# Patient Record
Sex: Female | Born: 1970 | Race: Black or African American | Hispanic: No | Marital: Single
Health system: Southern US, Community
[De-identification: ages and names within clinical notes are randomized; demographics above are authoritative.]

---

## 1997-05-23 ENCOUNTER — Other Ambulatory Visit: Admission: RE | Admit: 1997-05-23 | Discharge: 1997-05-23 | Payer: Self-pay | Admitting: Obstetrics and Gynecology

## 1998-01-04 ENCOUNTER — Inpatient Hospital Stay (HOSPITAL_COMMUNITY): Admission: AD | Admit: 1998-01-04 | Discharge: 1998-01-06 | Payer: Self-pay | Admitting: Obstetrics and Gynecology

## 1998-01-26 ENCOUNTER — Other Ambulatory Visit: Admission: RE | Admit: 1998-01-26 | Discharge: 1998-01-26 | Payer: Self-pay | Admitting: Obstetrics and Gynecology

## 1998-07-31 ENCOUNTER — Other Ambulatory Visit: Admission: RE | Admit: 1998-07-31 | Discharge: 1998-07-31 | Payer: Self-pay | Admitting: Obstetrics and Gynecology

## 1998-11-17 ENCOUNTER — Inpatient Hospital Stay (HOSPITAL_COMMUNITY): Admission: AD | Admit: 1998-11-17 | Discharge: 1998-11-17 | Payer: Self-pay | Admitting: Obstetrics and Gynecology

## 1999-02-11 ENCOUNTER — Inpatient Hospital Stay (HOSPITAL_COMMUNITY): Admission: AD | Admit: 1999-02-11 | Discharge: 1999-02-14 | Payer: Self-pay | Admitting: Obstetrics & Gynecology

## 1999-04-16 ENCOUNTER — Ambulatory Visit (HOSPITAL_COMMUNITY): Admission: RE | Admit: 1999-04-16 | Discharge: 1999-04-16 | Payer: Self-pay | Admitting: Obstetrics and Gynecology

## 1999-06-29 ENCOUNTER — Encounter: Payer: Self-pay | Admitting: Emergency Medicine

## 1999-06-29 ENCOUNTER — Emergency Department (HOSPITAL_COMMUNITY): Admission: EM | Admit: 1999-06-29 | Discharge: 1999-06-29 | Payer: Self-pay | Admitting: Emergency Medicine

## 1999-07-21 ENCOUNTER — Ambulatory Visit (HOSPITAL_COMMUNITY): Admission: RE | Admit: 1999-07-21 | Discharge: 1999-07-21 | Payer: Self-pay

## 2007-12-24 ENCOUNTER — Emergency Department (HOSPITAL_COMMUNITY): Admission: EM | Admit: 2007-12-24 | Discharge: 2007-12-25 | Payer: Self-pay | Admitting: Emergency Medicine

## 2007-12-26 ENCOUNTER — Encounter (INDEPENDENT_AMBULATORY_CARE_PROVIDER_SITE_OTHER): Payer: Self-pay | Admitting: General Surgery

## 2007-12-26 ENCOUNTER — Ambulatory Visit (HOSPITAL_COMMUNITY): Admission: RE | Admit: 2007-12-26 | Discharge: 2007-12-27 | Payer: Self-pay | Admitting: General Surgery

## 2010-06-22 NOTE — Op Note (Signed)
[name], [name]             ACCOUNT NO.:  [id number]   MEDICAL RECORD NO.:  [id number]          PATIENT TYPE:  OIB   LOCATION:  1532                         FACILITY:  [facility]   PHYSICIAN:  [name], M.D.DATE OF BIRTH:  [birth year]   DATE OF PROCEDURE:  12/26/2007  DATE OF DISCHARGE:                               OPERATIVE REPORT   PREOPERATIVE DIAGNOSES:  Chronic cholecystitis with stones.  Clinically,  she passed a common duct stone.   POSTOPERATIVE DIAGNOSES:  Chronic cholecystitis with stones.  Clinically, she passed a common duct stone.   OPERATIONS:  Laparoscopic cholecystectomy with cholangiogram.   ANESTHESIA:  General anesthesia.   SURGEON:  Dr. Consuello Ali.   ASSISTANT:  Dr. Leonie Ali.   HISTORY:  [name] is a 40 year old female who started having  episodes of epigastric pain that later radiated to her back over the  weekend.  She went to the [place] walk-in clinic on Monday.  She was  evaluated and found to have bile in her urine.  The white count was not  elevated, but she was advised to go to the [facility] emergency room  which she did.  When she checked in there about 8 o'clock, she said her  pain subsided shortly afterwards.  She was seen by the ER physician.  They got laboratory studies and her bilirubin was approximately 3.5 and  SGOT and SGPT clinically significantly elevated.  They repeated a CBC  and the white count was not elevated.  They did not give her any pain  medication, but she was there most of the evening.  They called me at  approximately 5:00 a.m. on Tuesday morning.  I said I would see her, and  I did about an hour later and she was completely asymptomatic.  I looked  at the ultrasound and felt that clinically she had definitely passed a  common duct stone and you could see that there were multiple stones  within her gallbladder.  I recommended that one since she was pain free  that I would add her onto the OR  schedule the following day as I did  have time available and repeat her liver function studies and if the  liver tests were improving that we would proceed on with a lap  gallbladder with cholangiogram.  If her bilirubin was becoming more  elevated, then we would consider a preoperative ERCP.  She returns  today, says she has had no further episodes of pain.  I repeated her  liver tests.  Her bilirubin now was 1.2 and SGOT/SGPT are all markedly  improved.  We are planning to proceed on with the laparoscopic  cholecystectomy with cholangiogram at this time.   DESCRIPTION OF PROCEDURE:  Preoperatively, we gave her 3 grams of  Unasyn.  She has got PAS stockings.  Induction of general anesthesia,  endotracheal tube and oral tube into the stomach.  The abdomen was  prepped with Betadine surgical solution and draped in a sterile manner.  A small incision was made below the umbilicus and then the fascia was  identified.  This was picked  up with a Kocher and very carefully entered  into the peritoneal cavity.  The pursestring suture of 0 Vicryl was  placed.  A Hassan cannula was introduced.  The gallbladder was not  tense, but was quite large and there were really no adhesions around it.  The upper 10 mL trocar was placed through the falciform under direct  vision.  The two lateral 5 mm trocars were placed by Dr. Lurene Ali the  assistant and then we retracted the upward and lateral of the  gallbladder over the peritoneum and encompassed the cystic duct.  The  cystic artery was lying just anterior to it and I doubly clipped this  proximally, singly and divided the artery and then encompassed the  cystic duct and put a clip at the junction of the distal cystic duct  gallbladder junction.  A Cook catheter was used for the cholangiogram,  held in place with a clip.  It showed good prompt filling of the  extrahepatic biliary system and good flow into the duodenum and no  evidence of any common duct  stone at this time.  I then triply clipped  the cystic duct, divided it and freed up the gallbladder from its  subhepatic area very carefully with good hemostasis and then placed  gallbladder in an EndoCatch bag.  I then switched the camera to the  upper 10 mm port, withdrew the bag containing the gallbladder and then  closed the fascia at the umbilicus with additional figure-of-eight  suture of 0 Vicryl, anesthetized the fascia at the umbilicus.  The other  fascial ports had been anesthetized before trocar placement.  The  subcutaneous wounds were closed with 4-0 Vicryl.  Benzoin and Steri-  Strips on the skin.  The patient tolerated the procedure and extubated  and sent to recovery room in stable postop condition.  The patient is  taking some type of forensic course of [facility] and asked if she could have  samples of her gallstones, and we also took some pictures with the  camera which we will give her.  On opening the gallbladder, there was  one stone about 1.5 cm in size and then numerous small stones that were  about 2-3 mm in size, and I am sure that one of these is the type that  have passed.           ______________________________  Nicole Pancoast. [name], M.D.     WJW/MEDQ  D:  12/26/2007  T:  12/26/2007  Job:  [id number]

## 2010-06-22 NOTE — H&P (Signed)
[name], [name]             ACCOUNT NO.:  [id number]   MEDICAL RECORD NO.:  [id number]          PATIENT TYPE:  EMS   LOCATION:  ED                           [facility]ACILITY:  [facility]   PHYSICIAN:  Anselm Pancoast. Weatherly, M.D.DATE OF BIRTH:  February 04, 1971   DATE OF ADMISSION:  12/25/2007  DATE OF DISCHARGE:                              HISTORY & PHYSICAL   CHIEF COMPLAINT:  Epigastric pain, gallstones.   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  She states that she had the onset of upper epigastric, right upper  quadrant pain.  It kind of radiated around to her back, and she went to  the [place] walk-in clinic and they noted that the pain was constant,  pretty severe.  She had had some nausea and they did laboratory studies  that noticed that she had bile in her urine, and a CBC showed a white  count of 6900 with a hematocrit of 35.  She was referred to the ER here  at [facility].  Came in about 10 o'clock, and stated that her pain sort  of resolved about the time she arrived.  She has been here in the ER and  was evaluated.  An ultrasound of the gallbladder was obtained which  showed multiple stones within her gallbladder.  Her common bile duct  does not appear to be dilated.  When she first arrived it was about 9  o'clock.  She had a slightly elevated blood pressure of 149/89, but then  her pain she said stopped about an hour later with no pain medication  and she has slept since then.  Her  laboratory studies are definitely  abnormal in that her SGOT is 514, her SGPT of 602.  Her lipase was 20.  Her pain is completely gone now, and her white count was repeated and  was 8800.  I think clinically she has probably passed a common duct  stone.  Her total bilirubin was 3, and arrangements have been made to  add her on for the  OR schedule for tomorrow.  That is Thursday at 10:30,  and I will repeat a CMET prior to the surgery.  I have discussed with  the patient that she has obviously got gallstones.  Whether the stone  has gotten out of the gallbladder into the bile duct is what I would  expect even though the ultrasound does not confirm that at this time,  but it does confirm multiple small stones within her gallbladder.  We  discussed about the possibility that she may need an ERCP if her liver  tests are becoming more abnormal, and if she is more jaundiced we will  cancel her surgery and get the ERCP tomorrow.  If the liver tests are  better will proceed with the cholecystectomy, but possibly will need an  ERCP afterwards.  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  I am going to give her some Vicodin which she will have  if she has mild pain, but she understands that if she has a severe  episode of pain similar to what she has had yesterday or starts having  fever to return to the ER or to call us to get admitted.  The patient is  not on any chronic medications and denies any allergies.  Periods are  regular.  Last one was approximately a week ago, and she is a  nondrinker, nonsmoker,  and has 2 children.   PHYSICAL EXAMINATION AT THIS TIME:  She is not acutely tender.  LUNGS:  Are in a state of hydration, appear normal.   Consultation note in the ER for chronic cholecystitis, probably passage  of a common duct stone.           ______________________________  Anselm Pancoast. Zachery Dakins, M.D.    WJW/MEDQ  D:  12/25/2007  T:  12/25/2007  Job:  [id number]

## 2010-06-25 NOTE — Op Note (Signed)
[facility]  Patient:    [name], [name]                  MRN: [id number] Proc. Date: 04/16/99 Adm. Date:  [id number] Attending:  Miguel Aschoff                           Operative Report  PREOPERATIVE DIAGNOSIS:       1. Desired sterilization.  POSTOPERATIVE DIAGNOSIS:      1. Same.  PROCEDURE:                    Laparoscopic tubal using cautery and division.  SURGEON:                      Miguel Aschoff, M.D.  ANESTHESIA:                   General.  COMPLICATIONS:                None.  JUSTIFICATION:                The patient is a 40 year old black female para 2-0-0-2 who has requested that her sterilization procedure be performed for socioeconomic reasons.  She understands the risks ______ of the procedure. Understands that this should result in her sterility, but that this can not be guaranteed.  With this information she has given informed consent for laparoscopic tubal sterilization.  PROCEDURE:                    Patient was taken to the operating room, placed in a supine position.  General anesthesia was administered without difficulty.  She as then placed in the dorsal lithotomy position, prepped and draped in the usual sterile fashion.  Bladder was catheterized.  Hulka tenaculum was placed in the cervix and held.  Once this was done attention was directed to the umbilicus where a small infraumbilical incision was made.  A Veress needle was inserted and abdomen was then insufflated with 3 L of CO2.  Following the insufflation the trocar through the laparoscope was placed followed by laparoscope itself.  Then under direct visualization a second 5 mm port was established suprapubically. Systematic inspection of pelvic organs showed the uterus to be anterior, normal size and shape.  The anterior bladder peritoneum was unremarkable.  The tubes were then inspected, were normal along their course.  ______ were fine and delicate.   The  ovaries were totally within normal limits.  The cul-de-sac was unremarkable. Intestinal surfaces were within normal limits.  Liver surface was unremarkable.  Gallbladder was visualized and appeared to be within normal limits, although distended.  At this point the bipolar cautery forceps were introduced.  The mid  portion of each tube was grasped and cauterized in its mid portion for approximately 3 cm.  After satisfactory cauterization the laparoscopic scissors  were introduced and the tubes were divided in the area of the cautery.  There was good separation and good hemostasis.  At this point the CO2 was allowed to escape. All instruments were removed and then the small incisions were closed using subcuticular 4-0 Vicryl.  The estimated blood loss was less than 10 cc.  The patient tolerated the procedure well and went to the recovery room in satisfactory condition.  Patient is to be discharged home.  DISCHARGE MEDICATIONS:        Tylox one  every three to four hours as needed for  pain.  FOLLOW-UP:                    She will be seen back in the office in four weeks for follow-up examination.  She is to call if there are any problems such as fever,  pain, or heavy bleeding. DD:  04/17/99 TD:  04/17/99 Job: [id number]

## 2010-11-10 LAB — COMPREHENSIVE METABOLIC PANEL WITH GFR
ALT: 318 — ABNORMAL HIGH
ALT: 602 — ABNORMAL HIGH
AST: 112 — ABNORMAL HIGH
AST: 514 — ABNORMAL HIGH
Albumin: 3.3 — ABNORMAL LOW
Albumin: 3.7
Alkaline Phosphatase: 153 — ABNORMAL HIGH
Alkaline Phosphatase: 189 — ABNORMAL HIGH
BUN: 6
BUN: 8
CO2: 25
CO2: 26
Calcium: 8.7
Calcium: 9.4
Chloride: 106
Chloride: 106
Creatinine, Ser: 0.85
Creatinine, Ser: 0.91
GFR calc Af Amer: >60
GFR calc Af Amer: >60
GFR calc non Af Amer: >60
GFR calc non Af Amer: >60
Glucose, Bld: 132 — ABNORMAL HIGH
Glucose, Bld: 99
Potassium: 3.4 — ABNORMAL LOW
Potassium: 3.5
Sodium: 138
Sodium: 140
Total Bilirubin: 1.2
Total Bilirubin: 3.4 — ABNORMAL HIGH
Total Protein: 6.8
Total Protein: 7.6

## 2010-11-10 LAB — LIPASE, BLOOD: Lipase: 20

## 2010-11-10 LAB — DIFFERENTIAL
Basophils Absolute: 0
Eosinophils Absolute: 0
Lymphocytes Relative: 18
Neutro Abs: 6.9
Neutrophils Relative %: 79 — ABNORMAL HIGH

## 2010-11-10 LAB — URINE MICROSCOPIC-ADD ON

## 2010-11-10 LAB — CBC
HCT: 35.1 — ABNORMAL LOW
Hemoglobin: 11.5 — ABNORMAL LOW
WBC: 8.8

## 2010-11-10 LAB — AMYLASE: Amylase: 29

## 2010-11-10 LAB — URINALYSIS, ROUTINE W REFLEX MICROSCOPIC
Glucose, UA: NEGATIVE
Hgb urine dipstick: NEGATIVE
Protein, ur: NEGATIVE

## 2010-11-10 LAB — POCT PREGNANCY, URINE: Preg Test, Ur: NEGATIVE

## 2012-05-30 ENCOUNTER — Other Ambulatory Visit: Payer: Self-pay | Admitting: Obstetrics and Gynecology

## 2013-06-27 ENCOUNTER — Other Ambulatory Visit: Payer: Self-pay | Admitting: Obstetrics and Gynecology

## 2015-09-21 ENCOUNTER — Other Ambulatory Visit: Payer: Self-pay | Admitting: Obstetrics and Gynecology

## 2015-09-21 ENCOUNTER — Other Ambulatory Visit (HOSPITAL_COMMUNITY)
Admission: RE | Admit: 2015-09-21 | Discharge: 2015-09-21 | Disposition: A | Discharge status: Home / Self Care | Payer: BC Managed Care – PPO | Source: Ambulatory Visit | Attending: Obstetrics and Gynecology | Admitting: Obstetrics and Gynecology

## 2015-09-21 DIAGNOSIS — Z1151 Encounter for screening for human papillomavirus (HPV): Secondary | ICD-10-CM | POA: Insufficient documentation

## 2015-09-21 DIAGNOSIS — Z01419 Encounter for gynecological examination (general) (routine) without abnormal findings: Secondary | ICD-10-CM | POA: Diagnosis present

## 2015-09-23 ENCOUNTER — Other Ambulatory Visit: Payer: Self-pay | Admitting: Obstetrics and Gynecology

## 2015-09-23 LAB — CYTOLOGY - PAP

## 2016-02-16 ENCOUNTER — Other Ambulatory Visit: Payer: Self-pay | Admitting: Family Medicine

## 2016-02-16 DIAGNOSIS — Z1231 Encounter for screening mammogram for malignant neoplasm of breast: Secondary | ICD-10-CM

## 2016-03-24 ENCOUNTER — Ambulatory Visit
Admission: RE | Admit: 2016-03-24 | Discharge: 2016-03-24 | Disposition: A | Discharge status: Home / Self Care | Payer: BC Managed Care – PPO | Source: Ambulatory Visit | Attending: Family Medicine | Admitting: Family Medicine

## 2016-03-24 DIAGNOSIS — Z1231 Encounter for screening mammogram for malignant neoplasm of breast: Secondary | ICD-10-CM

## 2018-01-10 ENCOUNTER — Encounter: Payer: Self-pay | Admitting: Family Medicine

## 2018-01-11 ENCOUNTER — Other Ambulatory Visit: Payer: Self-pay | Admitting: Student

## 2018-01-11 ENCOUNTER — Ambulatory Visit
Admission: RE | Admit: 2018-01-11 | Discharge: 2018-01-11 | Disposition: A | Discharge status: Home / Self Care | Payer: BC Managed Care – PPO | Source: Ambulatory Visit | Attending: Family Medicine | Admitting: Family Medicine

## 2018-01-11 ENCOUNTER — Other Ambulatory Visit: Payer: Self-pay | Admitting: Family Medicine

## 2018-01-11 DIAGNOSIS — R7989 Other specified abnormal findings of blood chemistry: Secondary | ICD-10-CM

## 2018-01-11 MED ORDER — IOPAMIDOL (ISOVUE-370) INJECTION 76%
75.0000 mL | Freq: Once | INTRAVENOUS | Status: AC | PRN
  Administered 2018-01-11: 75 mL via INTRAVENOUS

## 2018-02-06 ENCOUNTER — Encounter: Payer: Self-pay | Admitting: Registered"

## 2018-02-06 ENCOUNTER — Encounter: Payer: BC Managed Care – PPO | Attending: Family Medicine | Admitting: Registered"

## 2018-02-06 DIAGNOSIS — R7303 Prediabetes: Secondary | ICD-10-CM | POA: Insufficient documentation

## 2018-02-06 NOTE — Progress Notes (Signed)
Medical Nutrition Therapy:  Appt start time: 0930 end time:  1030.  This patient is accompanied in the office by her spouse.  Assessment:  Primary concerns today:  Per referral A1c is 6.4% Pt states she wants to be healthy and come off some of her medications. Pt states after her MD visit last year she quit drinking sodas and remembers her A1c coming down a bit, but then when summer hit she started drinking soda 1/week and started drinking more until now she is drinking ~2/day. Pt's spouse tries to encourage her to just drink water. Pt's spouse also states that once he started using a CPAP for sleep apnea he didn't need to rely on soda for energy and believes that the patient may have sleep apnea.  Pt states she fell on ice a year ago and injured her knee and May this year had surgery for torn meniscus. Pt reports before knee injury she walked more, now it is more painful especially with rainy weather.  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   Sleep: 5-6 hrs. Hot flash wakes her up, but she is able to go back to sleep quickly. Stays up watching the news. Takes naps in the afternoon.  Stress: depends: work days 6-7/10, vacation time 2-3/10.   Preferred Learning Style:   No preference indicated   Learning Readiness:   Contemplating  MEDICATIONS: reviewed    DIETARY INTAKE:  Everyday foods include soda, fries.  Avoided foods include whole wheat.    24-hr recall:  B ( AM): bacon platter from [employer] OR bacon, eggs, instant, OJ or [employer] peach tea  Snk ( AM): none L ( PM): fast food chicken nuggets, fries, regular soda Snk ( PM): chips OR nabs OR fruit D ( PM): fast food chicken wings, salad, fries, soda Snk ( PM): none Beverages: OJ, [employer] tea, regular soda  Usual physical activity: ADLs  Estimated energy needs: 1800 calories  Progress Towards Goal(s):  New  goal.   Nutritional Diagnosis:  NI-5.8.2 Excessive carbohydrate intake As related to daily sugary beverage consumption.  As evidenced by dietary recall..    Intervention:  Nutrition Education for managing blood glucose with diet and lifestyle changes. Described diabetes and pre-diatetes.  Defined the role of glucose and insulin. Described the role of different macronutrients on glucose.  Explained how carbohydrates affect blood glucose.  Stated what foods contain the most carbohydrates.  Discussed the importance of sleep, exercise and stress management.  Plan: Next visit with Dr Hyacinth MeekerMiller you may want to clarify if she wants you to continue to take vitamin D over the counter when you finish the high dose prescription. Consider eating more nuts Aim to eat balanced meals and snacks Exercise as tolerated and after your knee heals consider incorporating regular moderate exercise 3-5 x per week 30-45 min. Consider discussing the potential of sleep apnea at your next MD visit Consider trying the sleep tips.   Teaching Method Utilized:  Visual Auditory   Handouts given during visit include:  MyPlate  A1c chart  Sleep hygiene  Barriers to learning/adherence to lifestyle change: none  Demonstrated degree of understanding via:  Teach Back   Monitoring/Evaluation:  Dietary intake, exercise, A1c, and body weight prn.

## 2018-02-06 NOTE — Patient Instructions (Addendum)
Next visit with Dr Hyacinth MeekerMiller you may want to clarify if she wants you to continue to take vitamin D over the counter when you finish the high dose prescription. Consider eating more nuts Aim to eat balanced meals and snacks Exercise as tolerated and after your knee heals consider incorporating regular moderate exercise 3-5 x per week 30-45 min. Consider discussing the potential of sleep apnea at your next MD visit Consider trying the sleep tips.

## 2018-11-12 ENCOUNTER — Other Ambulatory Visit: Payer: Self-pay | Admitting: Obstetrics and Gynecology

## 2018-11-12 ENCOUNTER — Other Ambulatory Visit (HOSPITAL_COMMUNITY)
Admission: RE | Admit: 2018-11-12 | Discharge: 2018-11-12 | Disposition: A | Discharge status: Home / Self Care | Payer: BC Managed Care – PPO | Source: Ambulatory Visit | Attending: Obstetrics and Gynecology | Admitting: Obstetrics and Gynecology

## 2018-11-12 DIAGNOSIS — Z124 Encounter for screening for malignant neoplasm of cervix: Secondary | ICD-10-CM | POA: Diagnosis not present

## 2018-11-16 LAB — CYTOLOGY - PAP
Diagnosis: NEGATIVE
High risk HPV: NEGATIVE

## 2019-11-15 ENCOUNTER — Other Ambulatory Visit: Payer: Self-pay | Admitting: Family Medicine

## 2019-11-15 DIAGNOSIS — Z1231 Encounter for screening mammogram for malignant neoplasm of breast: Secondary | ICD-10-CM

## 2019-11-18 ENCOUNTER — Other Ambulatory Visit: Payer: Self-pay

## 2019-11-18 ENCOUNTER — Ambulatory Visit
Admission: RE | Admit: 2019-11-18 | Discharge: 2019-11-18 | Disposition: A | Discharge status: Home / Self Care | Payer: BC Managed Care – PPO | Source: Ambulatory Visit | Attending: Family Medicine | Admitting: Family Medicine

## 2019-11-18 DIAGNOSIS — Z1231 Encounter for screening mammogram for malignant neoplasm of breast: Secondary | ICD-10-CM

## 2020-09-30 IMAGING — CT CT ANGIO CHEST
2 of 8 series · 11 of 36 positions shown · IV contrast (iopamidol)
Comparison: None.

CLINICAL DATA: Shortness of breath with exertion, dizziness,
lightheadedness, elevated D-dimer, recent right knee surgery June 2017

EXAM:
CT ANGIOGRAPHY CHEST WITH CONTRAST
TECHNIQUE: Multidetector CT imaging of the chest was performed using the
standard protocol during bolus administration of intravenous
contrast. Multiplanar CT image reconstructions and MIPs were
obtained to evaluate the vascular anatomy.
CONTRAST:  75mL D7UCR1-NAK IOPAMIDOL (D7UCR1-NAK) INJECTION 76%

[Series 7: cta pulmonary 2.00 bv36 s3 cor · coronal · 0.53mm/px · 1 of 166 slices shown]
[im 83/166  mediastinal]
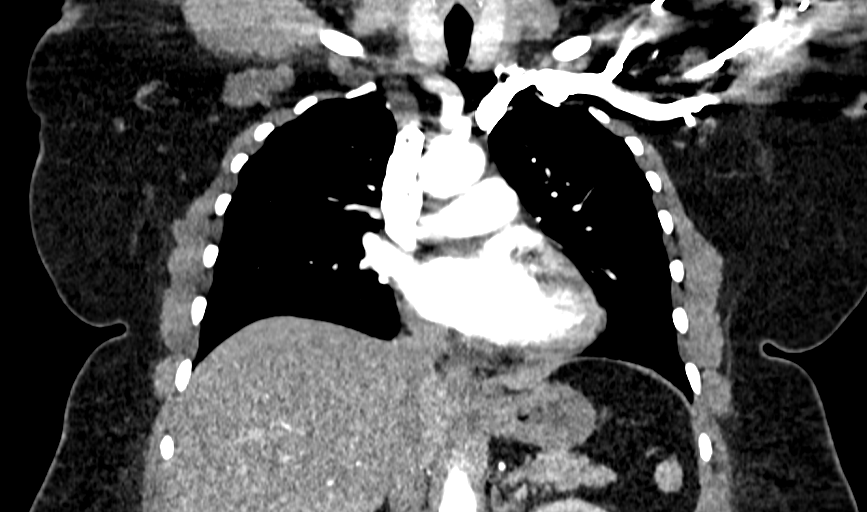

[Series 12: cta pulmonary 1.00 bv36 s3 super d. · axial · 0.90mm/px · z∈[+1654,+1874]mm · 10 of 338 slices shown]
[im 31/338  lung]
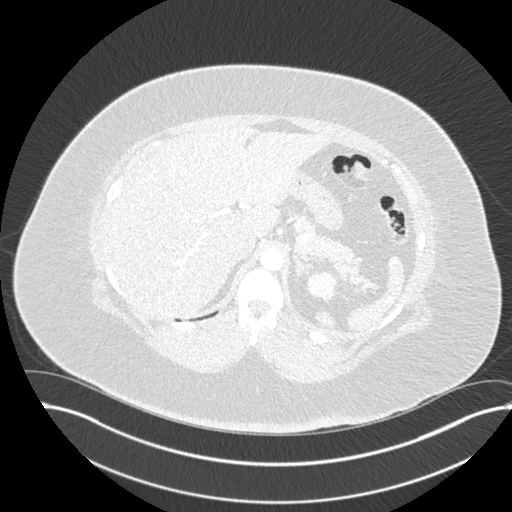
[im 62/338  mediastinal]
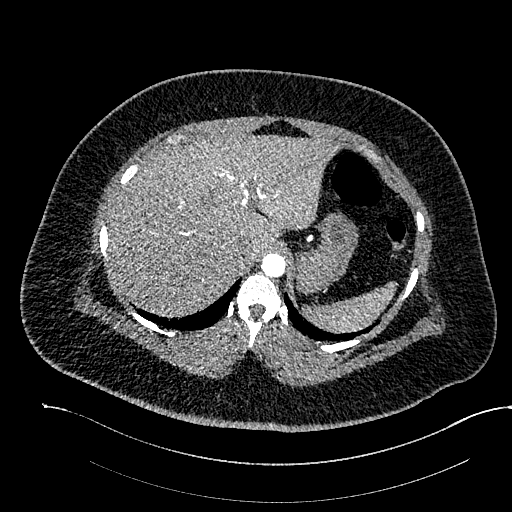
[im 92/338  lung]
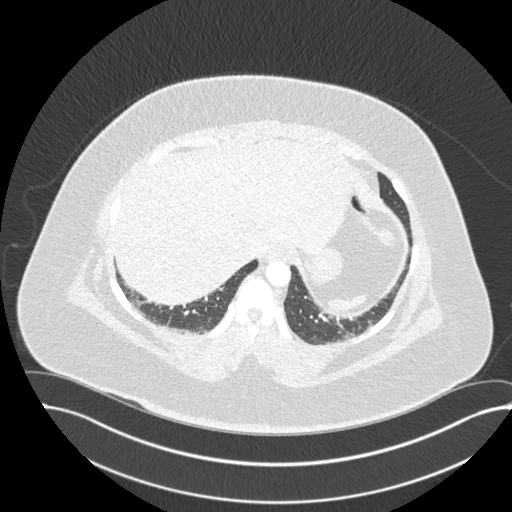
[im 123/338  mediastinal]
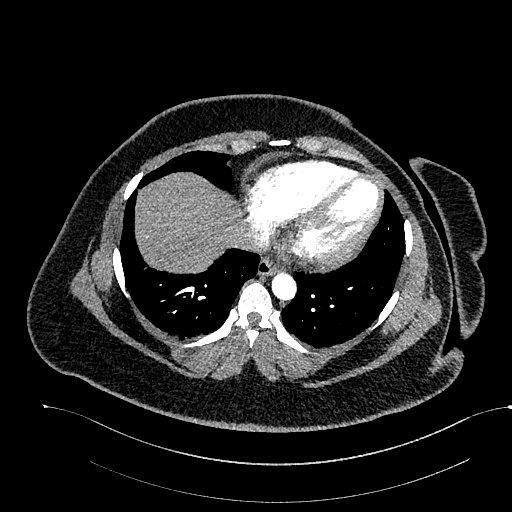
[im 154/338  lung]
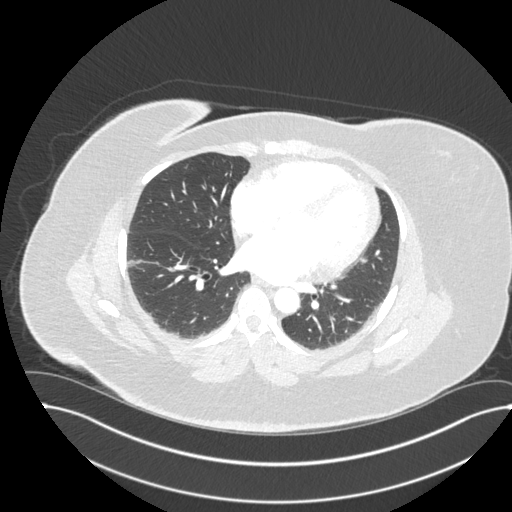
[im 184/338  mediastinal]
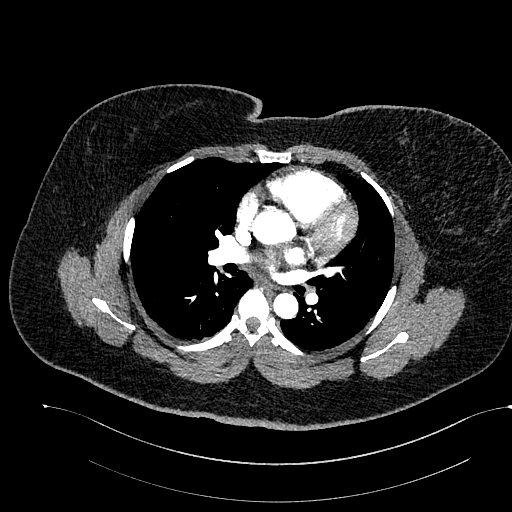
[im 215/338  lung]
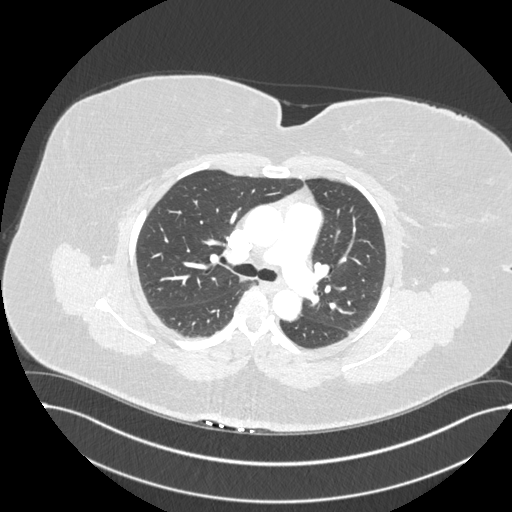
[im 246/338  mediastinal]
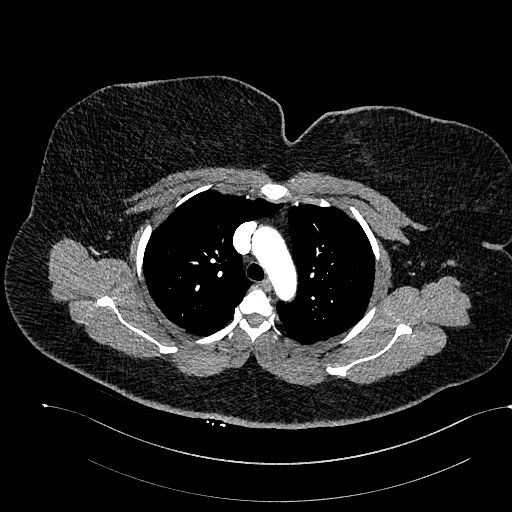
[im 276/338  lung]
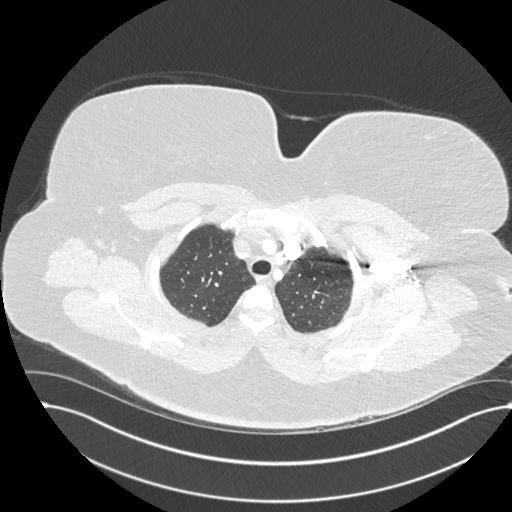
[im 307/338  mediastinal]
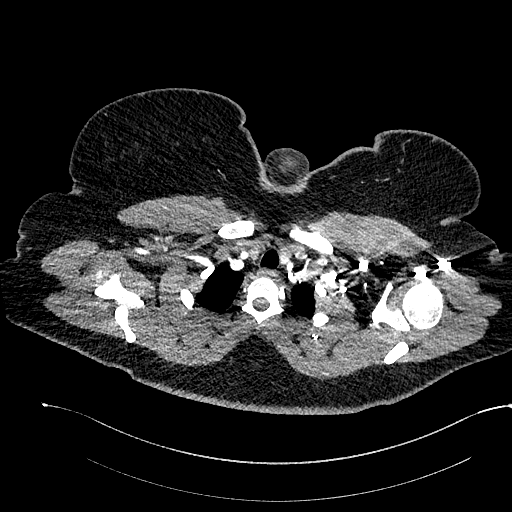

[11 of 36 positions shown; findings below may reference images not displayed]

FINDINGS: Cardiovascular: Pulmonary arteries appear patent. No significant
filling defect or pulmonary embolus demonstrated by CTA.

Intact thoracic aorta. Negative for aneurysm or dissection. No
mediastinal hemorrhage or hematoma. Patent 3 vessel arch anatomy.

Normal heart size.  No pericardial effusion.

Mediastinum/Nodes: No enlarged mediastinal, hilar, or axillary lymph
nodes. Thyroid gland, trachea, and esophagus demonstrate no
significant findings.

Lungs/Pleura: Minor dependent basilar atelectasis. No focal
pneumonia, collapse, or consolidation. Negative for edema, or
interstitial process. No pleural abnormality, effusion, or
pneumothorax. Trachea and central airways are patent.

Upper Abdomen: Remote cholecystectomy.  No acute finding.

Musculoskeletal: No chest wall abnormality. No acute or significant
osseous findings.

Review of the MIP images confirms the above findings.
IMPRESSION: Negative for significant acute pulmonary embolus by CTA.

Minor basilar atelectasis. No significant acute intrathoracic
finding

## 2021-01-26 ENCOUNTER — Other Ambulatory Visit: Payer: Self-pay | Admitting: Family Medicine

## 2021-01-26 DIAGNOSIS — Z1231 Encounter for screening mammogram for malignant neoplasm of breast: Secondary | ICD-10-CM

## 2021-02-25 ENCOUNTER — Ambulatory Visit: Payer: BC Managed Care – PPO

## 2021-08-24 ENCOUNTER — Other Ambulatory Visit: Payer: Self-pay | Admitting: Obstetrics and Gynecology

## 2021-08-24 DIAGNOSIS — Z1231 Encounter for screening mammogram for malignant neoplasm of breast: Secondary | ICD-10-CM

## 2021-09-03 ENCOUNTER — Ambulatory Visit
Admission: RE | Admit: 2021-09-03 | Discharge: 2021-09-03 | Disposition: A | Discharge status: Home / Self Care | Payer: BC Managed Care – PPO | Source: Ambulatory Visit | Attending: Obstetrics and Gynecology | Admitting: Obstetrics and Gynecology

## 2021-09-03 DIAGNOSIS — Z1231 Encounter for screening mammogram for malignant neoplasm of breast: Secondary | ICD-10-CM

## 2022-08-07 IMAGING — MG DIGITAL SCREENING BILAT W/ TOMO W/ CAD
6 of 10 series · 6 of 30 positions shown · non-contrast
Comparison: Previous exam(s).

CLINICAL DATA: Screening.

EXAM:
DIGITAL SCREENING BILATERAL MAMMOGRAM WITH TOMO AND CAD

[R CC synth-2D]
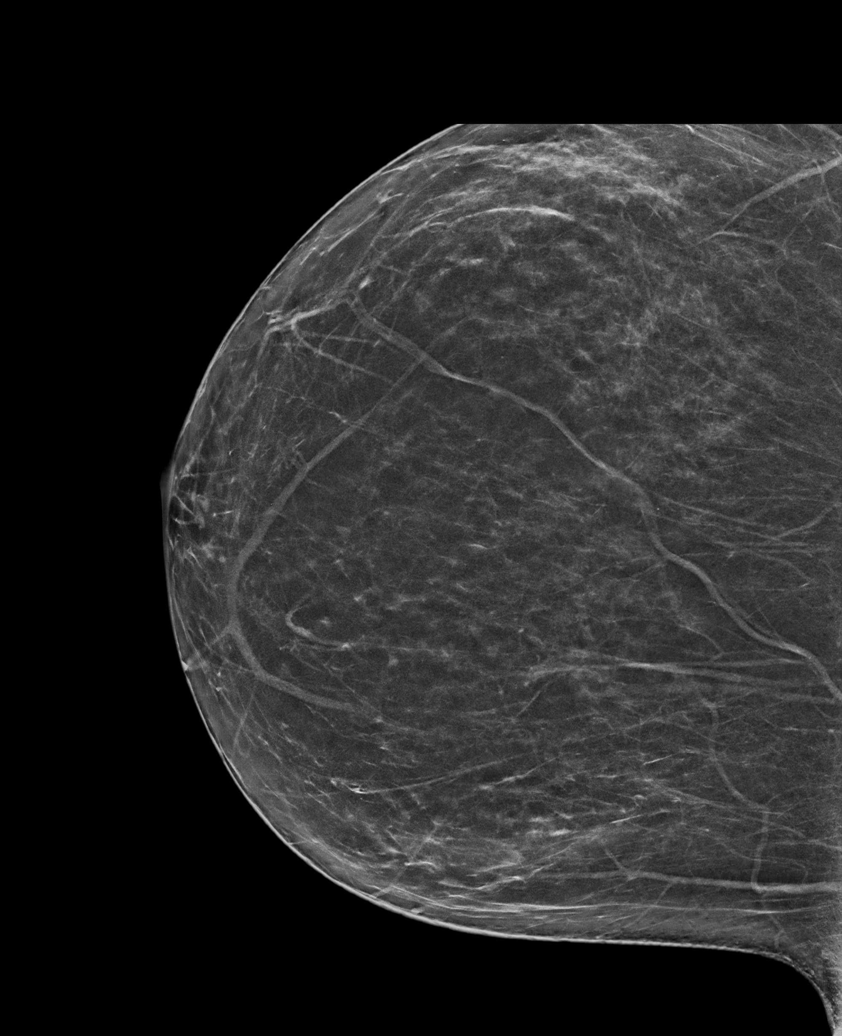

[L CC synth-2D]
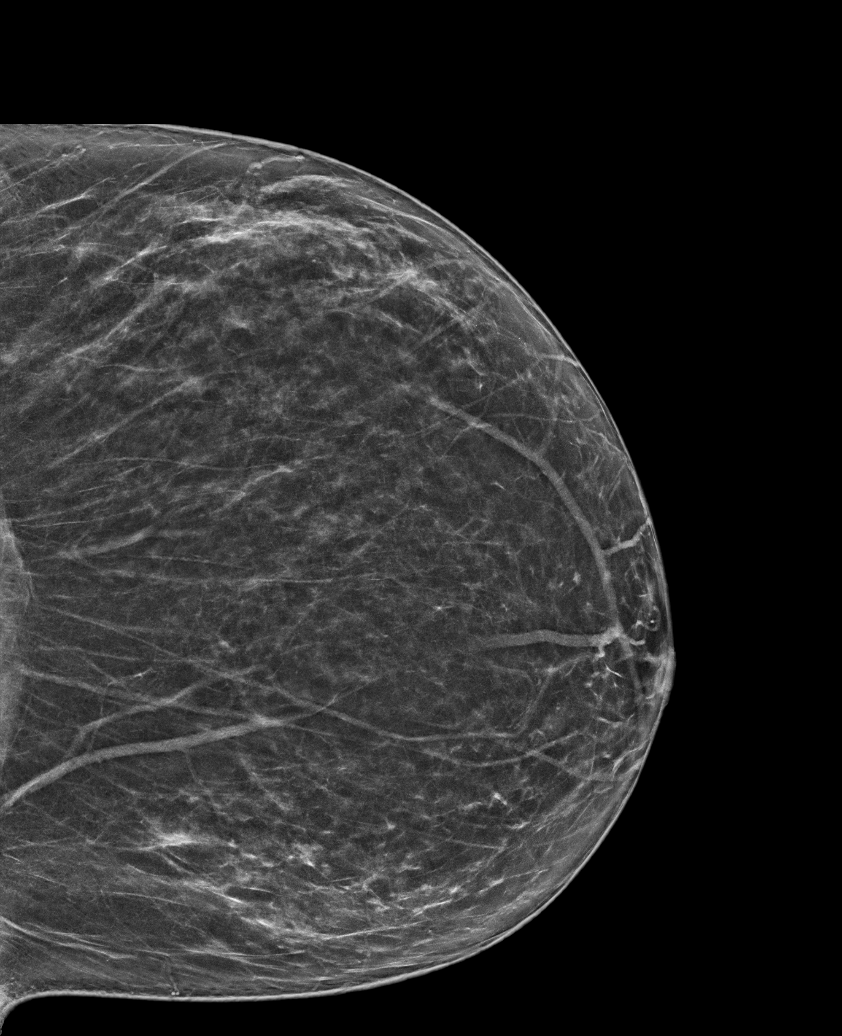

[L MLO synth-2D (1 of 2)]
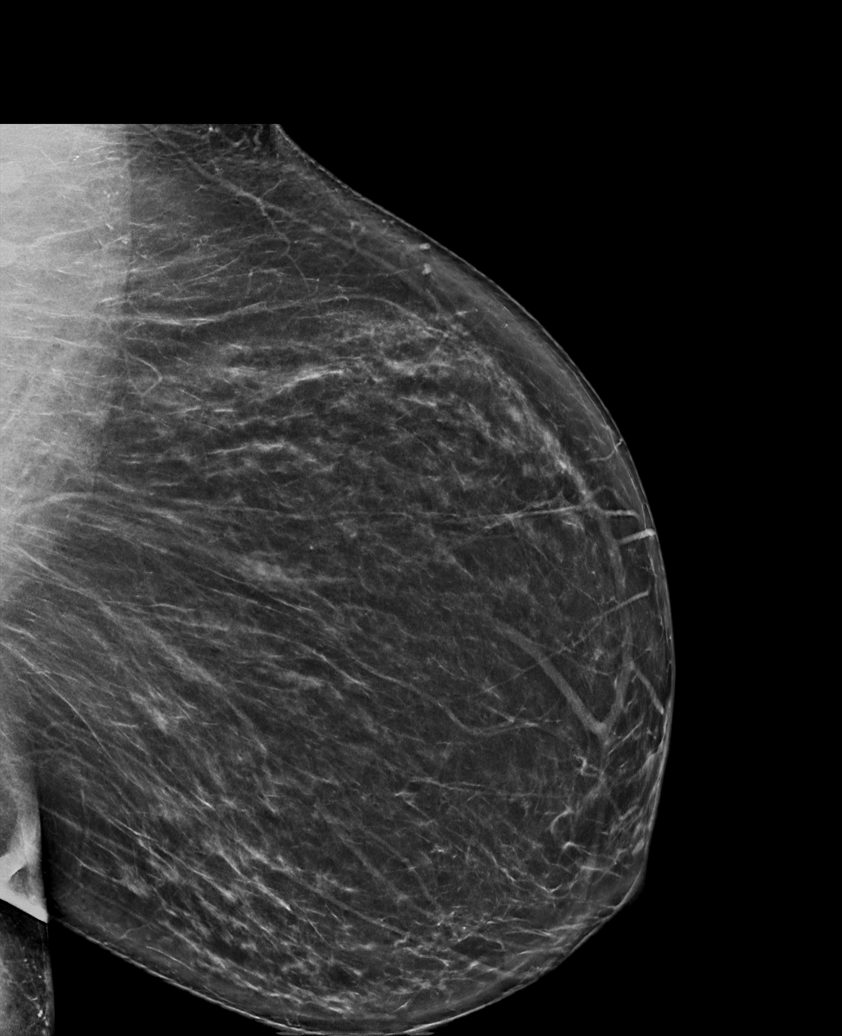

[L MLO synth-2D (2 of 2)]
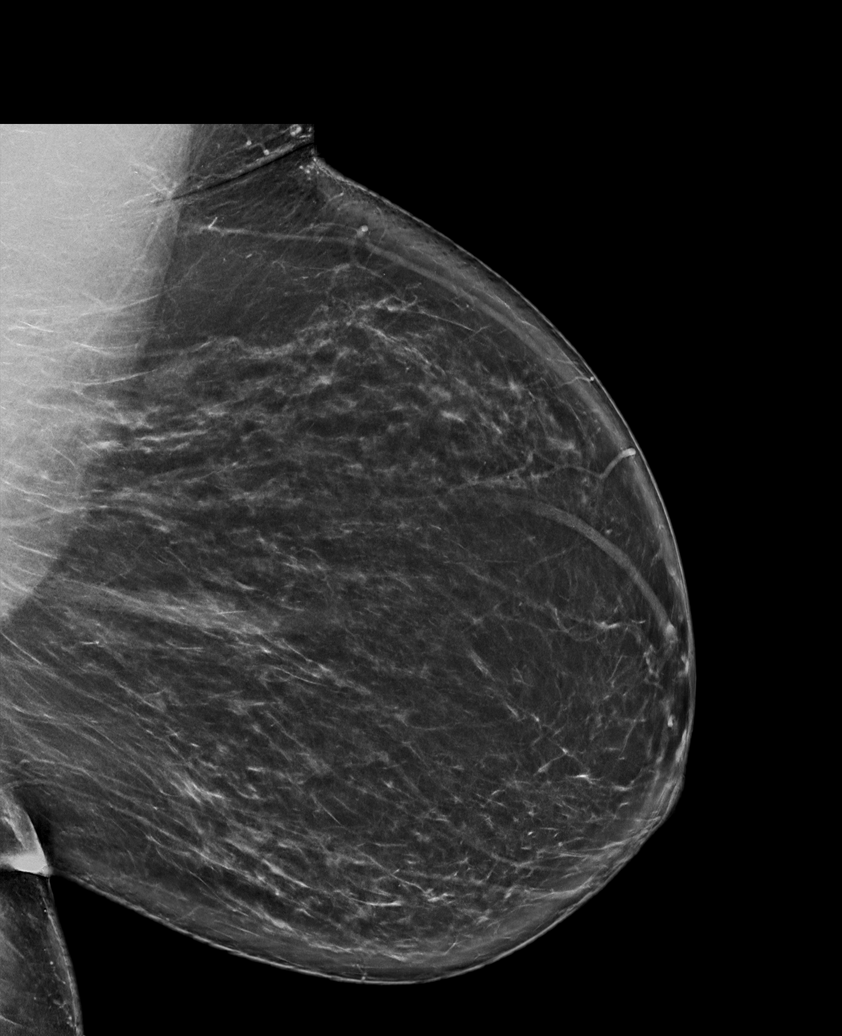

[R MLO synth-2D]
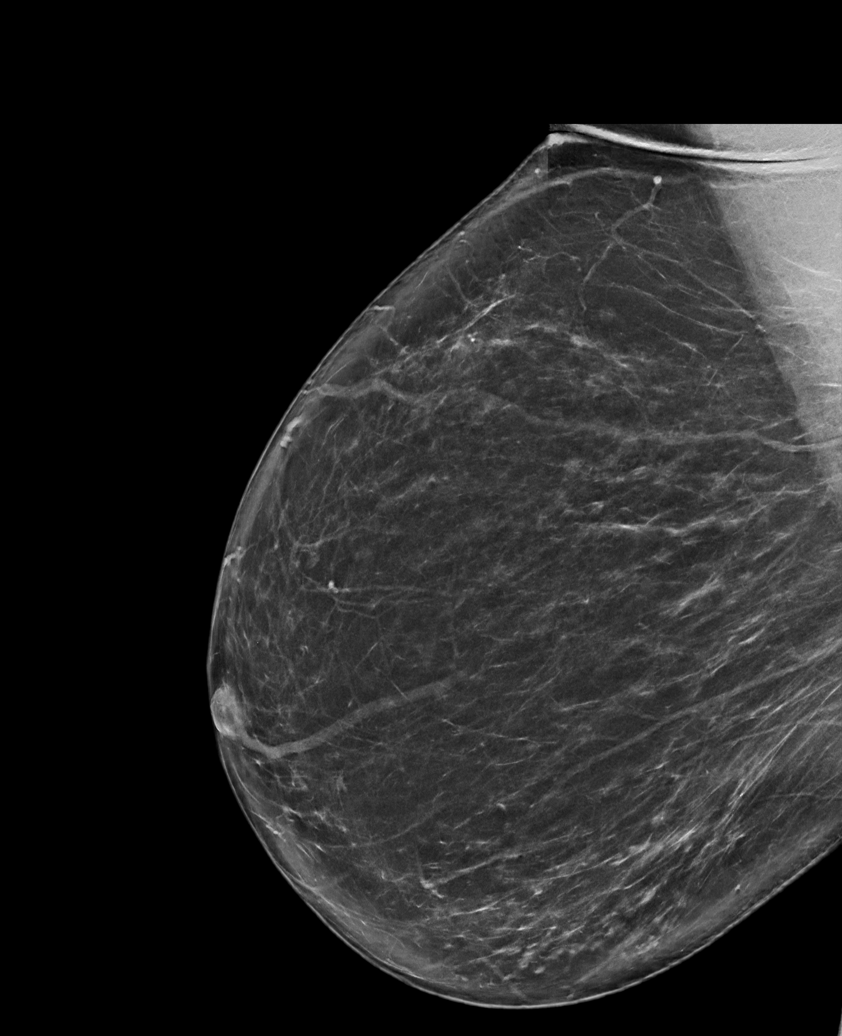

[L MLO tomo · tomo slice 43/85.0]
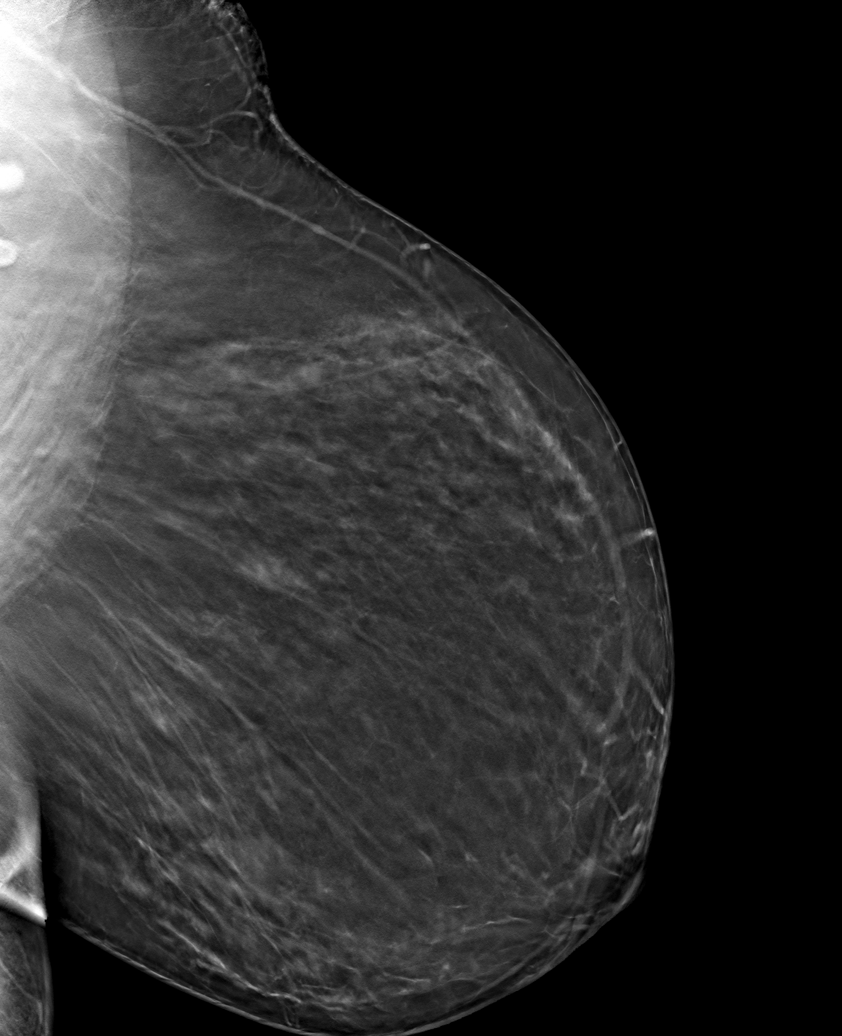

[6 of 30 positions shown; findings below may reference images not displayed]

ACR Breast Density Category b: There are scattered areas of
fibroglandular density.
FINDINGS: There are no findings suspicious for malignancy. Images were
processed with CAD.
IMPRESSION: No mammographic evidence of malignancy. A result letter of this
screening mammogram will be mailed directly to the patient.

RECOMMENDATION:
Screening mammogram in one year. (Code:[id number])

BI-RADS CATEGORY  1: Negative.

## 2023-03-10 ENCOUNTER — Other Ambulatory Visit: Payer: Self-pay | Admitting: Obstetrics and Gynecology

## 2023-03-10 DIAGNOSIS — Z1231 Encounter for screening mammogram for malignant neoplasm of breast: Secondary | ICD-10-CM

## 2023-03-31 ENCOUNTER — Ambulatory Visit: Payer: 59

## 2023-04-01 ENCOUNTER — Ambulatory Visit
Admission: RE | Admit: 2023-04-01 | Discharge: 2023-04-01 | Disposition: A | Discharge status: Home / Self Care | Payer: 59 | Source: Ambulatory Visit | Attending: Obstetrics and Gynecology | Admitting: Obstetrics and Gynecology

## 2023-04-01 DIAGNOSIS — Z1231 Encounter for screening mammogram for malignant neoplasm of breast: Secondary | ICD-10-CM
